# Patient Record
Sex: Male | Born: 1980 | Hispanic: No | Marital: Single
Health system: Southern US, Community
[De-identification: ages and names within clinical notes are randomized; demographics above are authoritative.]

---

## 2009-01-09 ENCOUNTER — Emergency Department (HOSPITAL_COMMUNITY): Admission: EM | Admit: 2009-01-09 | Discharge: 2009-01-09 | Payer: Self-pay | Admitting: Emergency Medicine

## 2009-01-31 ENCOUNTER — Emergency Department (HOSPITAL_COMMUNITY): Admission: EM | Admit: 2009-01-31 | Discharge: 2009-02-01 | Payer: Self-pay | Admitting: Emergency Medicine

## 2009-01-31 ENCOUNTER — Emergency Department (HOSPITAL_COMMUNITY): Admission: EM | Admit: 2009-01-31 | Discharge: 2009-01-31 | Payer: Self-pay | Admitting: Emergency Medicine

## 2010-01-05 IMAGING — CT CT HEAD W/O CM
1 of 2 series · 13 of 30 positions shown, 17 images · non-contrast
Comparison: 01/31/2009

CLINICAL DATA: Seizure

CT HEAD WITHOUT CONTRAST
TECHNIQUE: Contiguous axial images were obtained from the base of
the skull through the vertex without contrast.

[Series 2: brain · axial · 0.47mm/px · z∈[+129,+257]mm · 13 of 28 slices shown, 17 images]
[im 2/28  brain]
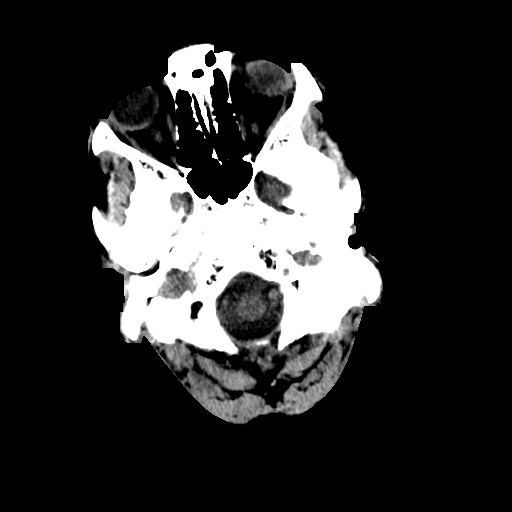
[im 2/28  bone]
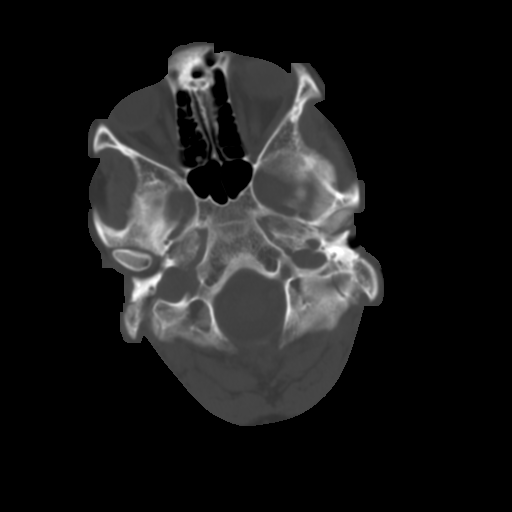
[im 4/28  brain]
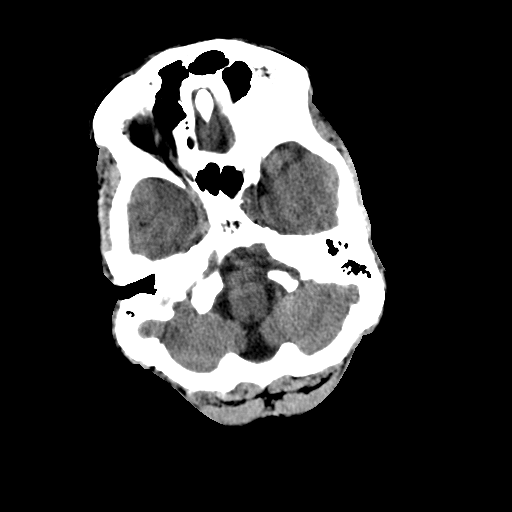
[im 6/28  brain]
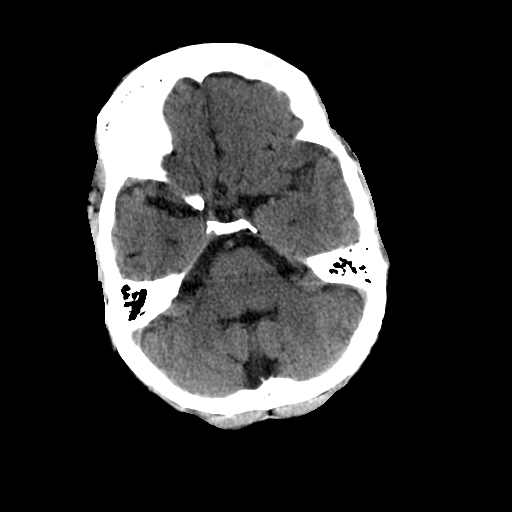
[im 8/28  brain]
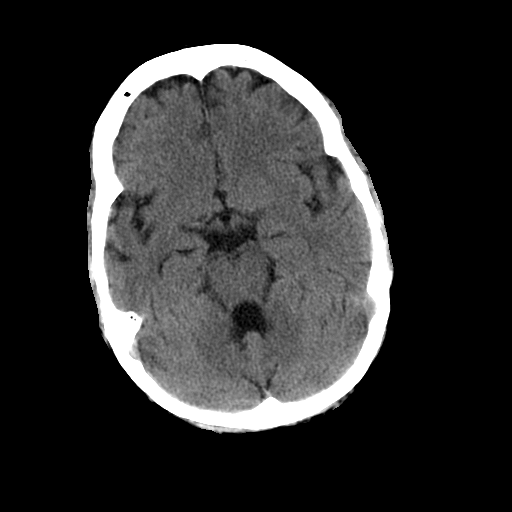
[im 10/28  brain]
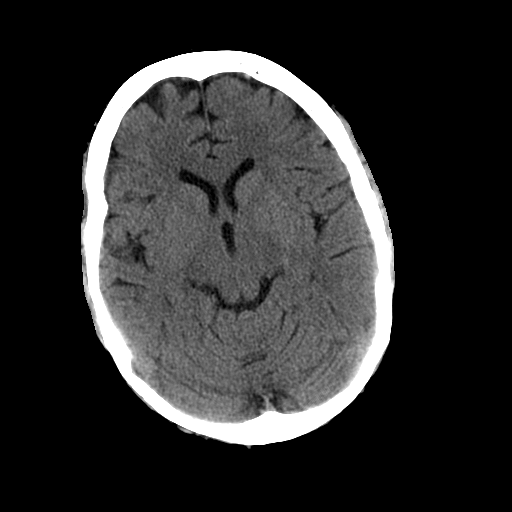
[im 10/28  bone]
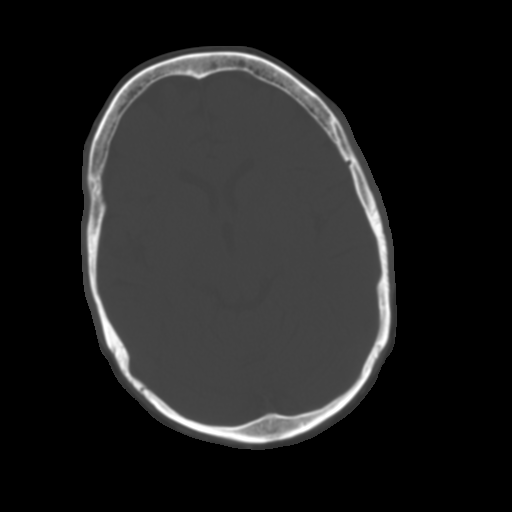
[im 12/28  brain]
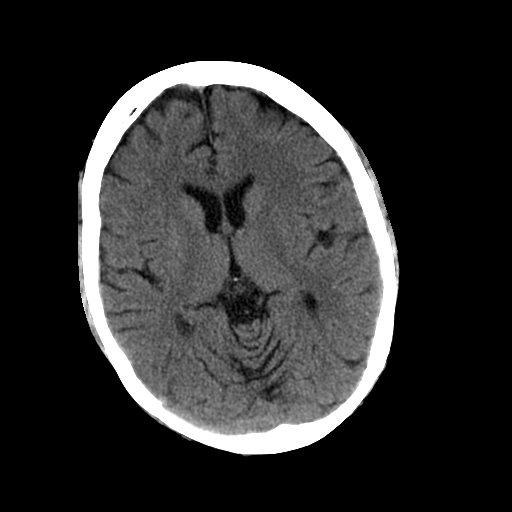
[im 14/28  brain]
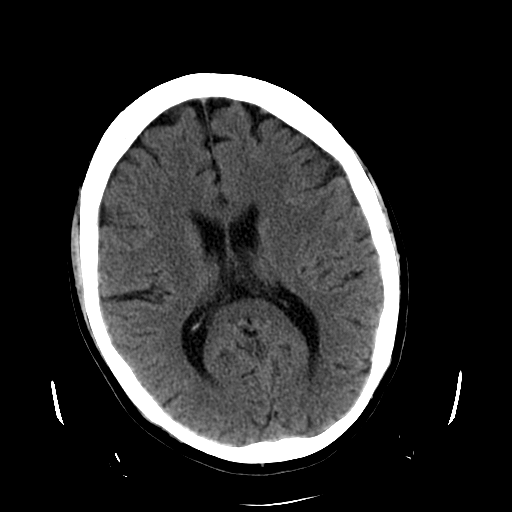
[im 16/28  brain]
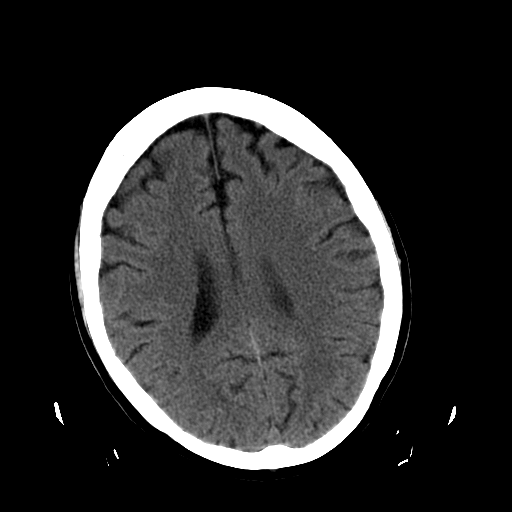
[im 18/28  brain]
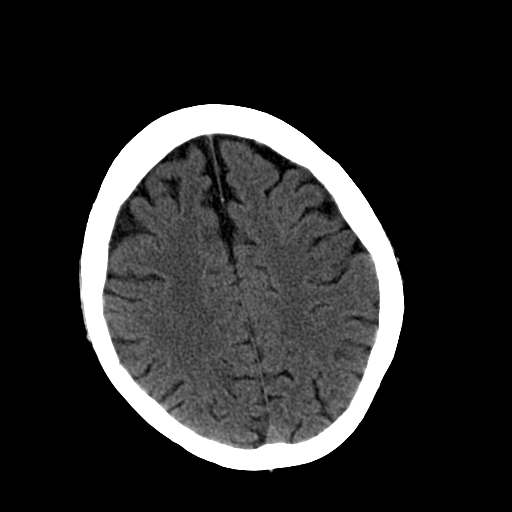
[im 18/28  bone]
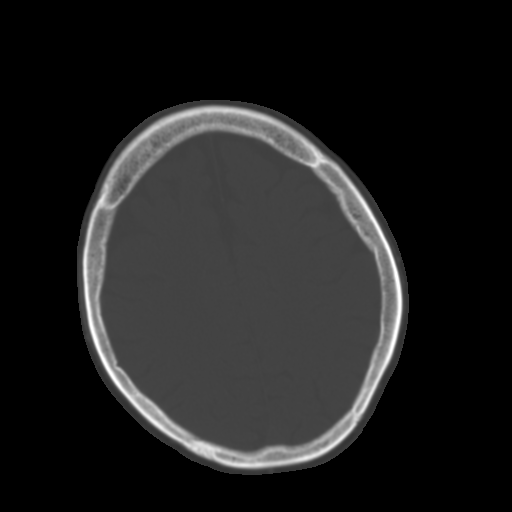
[im 20/28  brain]
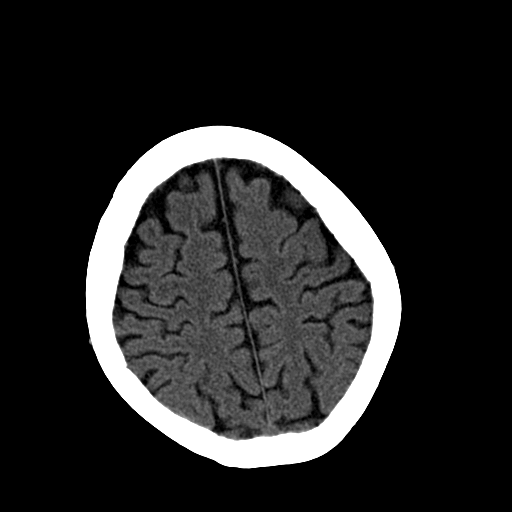
[im 22/28  brain]
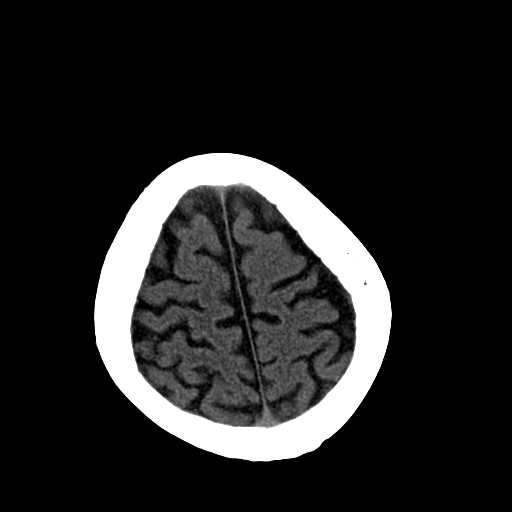
[im 24/28  brain]
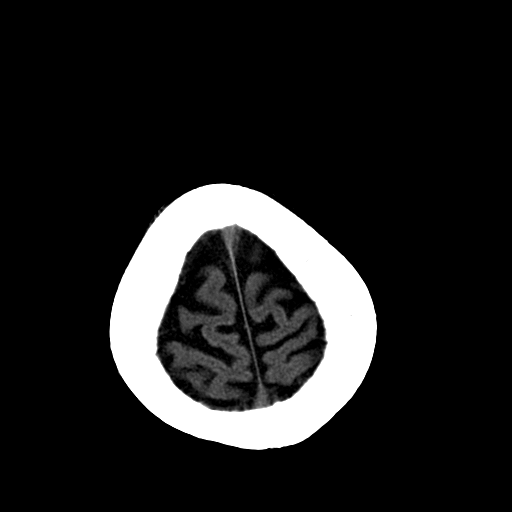
[im 26/28  brain]
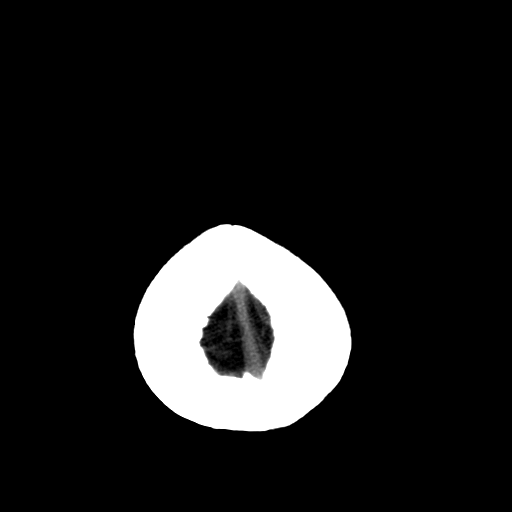
[im 26/28  bone]
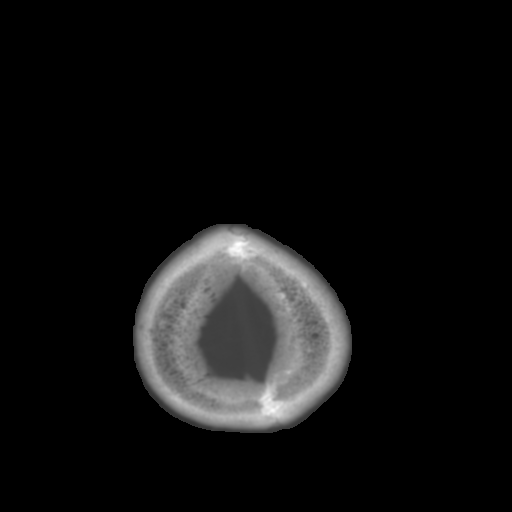

[13 of 30 positions shown; findings below may reference images not displayed]

FINDINGS: No acute hemorrhage, acute infarction, or mass lesion is
identified.  No midline shift.  No ventriculomegaly.  Orbits and
paranasal sinuses are intact.  No skull fracture.
IMPRESSION: No acute intracranial finding.

## 2010-07-18 LAB — HEPATIC FUNCTION PANEL
ALT: 82 U/L — ABNORMAL HIGH (ref 0–53)
AST: 52 U/L — ABNORMAL HIGH (ref 0–37)
Albumin: 3.9 g/dL (ref 3.5–5.2)
Bilirubin, Direct: <0.1 mg/dL (ref 0.0–0.3)
Total Bilirubin: 0.5 mg/dL (ref 0.3–1.2)
Total Protein: 6.9 g/dL (ref 6.0–8.3)

## 2010-07-18 LAB — CBC
MCHC: 34.1 g/dL (ref 30.0–36.0)
MCV: 92.7 fL (ref 78.0–100.0)
MCV: 93 fL (ref 78.0–100.0)
Platelets: 234 10*3/uL (ref 150–400)
Platelets: 238 10*3/uL (ref 150–400)
RBC: 3.57 MIL/uL — ABNORMAL LOW (ref 4.22–5.81)
RBC: 3.62 MIL/uL — ABNORMAL LOW (ref 4.22–5.81)
RDW: 13.3 % (ref 11.5–15.5)
WBC: 13.7 10*3/uL — ABNORMAL HIGH (ref 4.0–10.5)

## 2010-07-18 LAB — T4, FREE: Free T4: 0.96 ng/dL (ref 0.80–1.80)

## 2010-07-18 LAB — COMPREHENSIVE METABOLIC PANEL
ALT: 103 U/L — ABNORMAL HIGH (ref 0–53)
BUN: 8 mg/dL (ref 6–23)
CO2: 29 mEq/L (ref 19–32)
Calcium: 9.4 mg/dL (ref 8.4–10.5)
Chloride: 103 mEq/L (ref 96–112)
Creatinine, Ser: 0.59 mg/dL (ref 0.4–1.5)
GFR calc Af Amer: >60 mL/min (ref >60)
GFR calc non Af Amer: >60 mL/min (ref >60)
Glucose, Bld: 89 mg/dL (ref 70–99)
Potassium: 4.1 mEq/L (ref 3.5–5.1)
Sodium: 139 mEq/L (ref 135–145)
Total Bilirubin: 0.4 mg/dL (ref 0.3–1.2)
Total Protein: 7 g/dL (ref 6.0–8.3)

## 2010-07-18 LAB — URINALYSIS, ROUTINE W REFLEX MICROSCOPIC
Specific Gravity, Urine: 1.017 (ref 1.005–1.030)
Urobilinogen, UA: 0.2 mg/dL (ref 0.0–1.0)
pH: 6.5 (ref 5.0–8.0)

## 2010-07-18 LAB — DIFFERENTIAL
Basophils Relative: 0 % (ref 0–1)
Basophils Relative: 1 % (ref 0–1)
Eosinophils Absolute: 0.2 10*3/uL (ref 0.0–0.7)
Eosinophils Absolute: 0.3 10*3/uL (ref 0.0–0.7)
Lymphocytes Relative: 55 % — ABNORMAL HIGH (ref 12–46)
Lymphocytes Relative: 70 % — ABNORMAL HIGH (ref 12–46)
Monocytes Absolute: 1.1 10*3/uL — ABNORMAL HIGH (ref 0.1–1.0)
Monocytes Absolute: 1.1 10*3/uL — ABNORMAL HIGH (ref 0.1–1.0)
Monocytes Relative: 6 % (ref 3–12)
Monocytes Relative: 8 % (ref 3–12)

## 2010-07-18 LAB — GLUCOSE, CAPILLARY
Glucose-Capillary: 65 mg/dL — ABNORMAL LOW (ref 70–99)
Glucose-Capillary: 83 mg/dL (ref 70–99)

## 2010-07-18 LAB — BASIC METABOLIC PANEL
CO2: 28 mEq/L (ref 19–32)
Calcium: 8.8 mg/dL (ref 8.4–10.5)
Chloride: 101 mEq/L (ref 96–112)
Creatinine, Ser: 0.48 mg/dL (ref 0.4–1.5)
GFR calc non Af Amer: >60 mL/min (ref >60)
Glucose, Bld: 105 mg/dL — ABNORMAL HIGH (ref 70–99)
Sodium: 136 mEq/L (ref 135–145)

## 2010-07-18 LAB — RAPID URINE DRUG SCREEN, HOSP PERFORMED
Amphetamines: NOT DETECTED
Opiates: POSITIVE — AB
Tetrahydrocannabinol: POSITIVE — AB

## 2010-07-18 LAB — PHENYTOIN LEVEL, TOTAL: Phenytoin Lvl: <2.5 ug/mL — ABNORMAL LOW (ref 10.0–20.0)

## 2016-05-07 ENCOUNTER — Telehealth (INDEPENDENT_AMBULATORY_CARE_PROVIDER_SITE_OTHER): Payer: Self-pay | Admitting: "Endocrinology

## 2016-05-07 NOTE — Telephone Encounter (Signed)
Chart entered in error. The person that this chart belongs to has the same name as a patient I was trying to contact.

## 2016-05-12 ENCOUNTER — Telehealth (INDEPENDENT_AMBULATORY_CARE_PROVIDER_SITE_OTHER): Payer: Self-pay | Admitting: "Endocrinology

## 2016-05-12 NOTE — Telephone Encounter (Signed)
This chart entered in error.

## 2016-05-13 ENCOUNTER — Telehealth (INDEPENDENT_AMBULATORY_CARE_PROVIDER_SITE_OTHER): Payer: Self-pay | Admitting: "Endocrinology

## 2016-05-13 NOTE — Telephone Encounter (Signed)
I entered the wrong chart.
